# Patient Record
Sex: Female | Born: 1955 | Race: White | Hispanic: No | Marital: Single | Smoking: Never smoker
Health system: Southern US, Community
[De-identification: ages and names within clinical notes are randomized; demographics above are authoritative.]

## PROBLEM LIST (undated history)

## (undated) DIAGNOSIS — M810 Age-related osteoporosis without current pathological fracture: Secondary | ICD-10-CM

## (undated) DIAGNOSIS — I10 Essential (primary) hypertension: Secondary | ICD-10-CM

## (undated) HISTORY — PX: WISDOM TOOTH EXTRACTION: SHX21

## (undated) HISTORY — DX: Essential (primary) hypertension: I10

## (undated) HISTORY — PX: DILATION AND CURETTAGE OF UTERUS: SHX78

## (undated) HISTORY — PX: KNEE SURGERY: SHX244

## (undated) HISTORY — PX: INDUCED ABORTION: SHX677

## (undated) HISTORY — DX: Age-related osteoporosis without current pathological fracture: M81.0

---

## 2010-02-21 ENCOUNTER — Other Ambulatory Visit: Admission: RE | Admit: 2010-02-21 | Discharge: 2010-02-21 | Payer: Self-pay | Admitting: Family Medicine

## 2010-02-28 ENCOUNTER — Encounter: Admission: RE | Admit: 2010-02-28 | Discharge: 2010-02-28 | Payer: Self-pay | Admitting: Family Medicine

## 2010-03-02 ENCOUNTER — Emergency Department (HOSPITAL_COMMUNITY): Admission: EM | Admit: 2010-03-02 | Discharge: 2010-03-02 | Payer: Self-pay | Admitting: Emergency Medicine

## 2011-02-08 LAB — BASIC METABOLIC PANEL
BUN: 6 mg/dL (ref 6–23)
CO2: 26 mEq/L (ref 19–32)
Calcium: 8.9 mg/dL (ref 8.4–10.5)
Chloride: 96 mEq/L (ref 96–112)
Creatinine, Ser: 0.79 mg/dL (ref 0.4–1.2)
GFR calc Af Amer: >60 mL/min (ref >60)
Sodium: 131 mEq/L — ABNORMAL LOW (ref 135–145)

## 2011-02-08 LAB — DIFFERENTIAL
Basophils Relative: 0 % (ref 0–1)
Eosinophils Absolute: 0.1 10*3/uL (ref 0.0–0.7)
Eosinophils Relative: 1 % (ref 0–5)
Lymphs Abs: 2.6 10*3/uL (ref 0.7–4.0)
Monocytes Absolute: 0.7 10*3/uL (ref 0.1–1.0)
Neutro Abs: 4 10*3/uL (ref 1.7–7.7)
Neutrophils Relative %: 54 % (ref 43–77)

## 2011-02-08 LAB — URINALYSIS, ROUTINE W REFLEX MICROSCOPIC
Bilirubin Urine: NEGATIVE
Glucose, UA: NEGATIVE mg/dL
Hgb urine dipstick: NEGATIVE
Nitrite: NEGATIVE
Specific Gravity, Urine: 1.004 — ABNORMAL LOW (ref 1.005–1.030)
pH: 7 (ref 5.0–8.0)

## 2011-02-08 LAB — URINE MICROSCOPIC-ADD ON

## 2011-02-08 LAB — CBC
Hemoglobin: 15.1 g/dL — ABNORMAL HIGH (ref 12.0–15.0)
WBC: 7.5 10*3/uL (ref 4.0–10.5)

## 2011-10-23 IMAGING — CT CT HEAD W/O CM
1 of 2 series · 13 of 30 positions shown, 17 images · non-contrast
Comparison: None.

CLINICAL DATA: 53-year-old with hypertension.

CT HEAD WITHOUT CONTRAST
TECHNIQUE: Contiguous axial images were obtained from the base of
the skull through the vertex without contrast.

[Series 2: brain · axial · 0.49mm/px · z∈[+144,+281]mm · 13 of 32 slices shown, 17 images]
[im 3/32  brain]
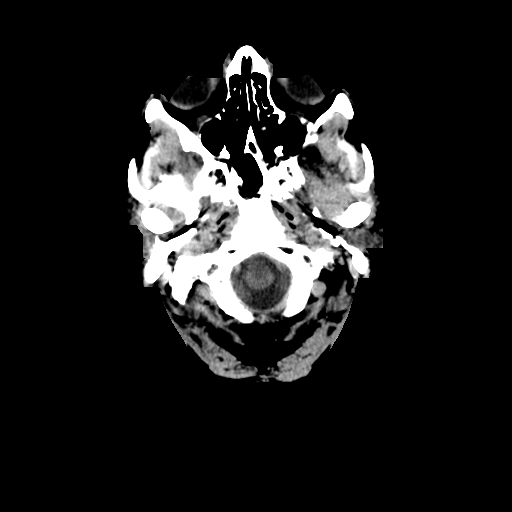
[im 3/32  bone]
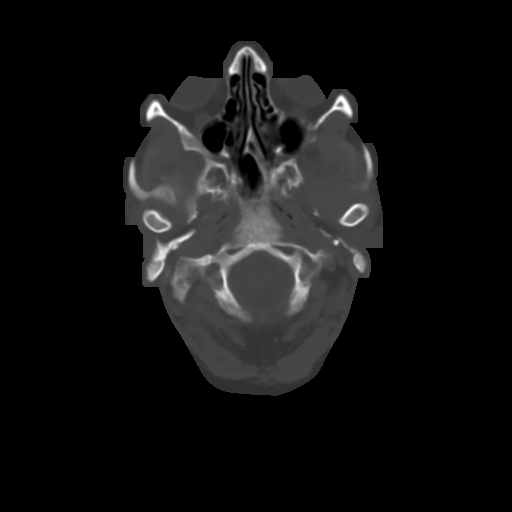
[im 5/32  brain]
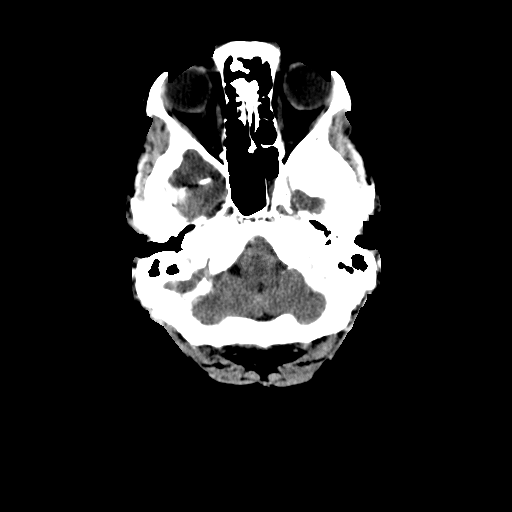
[im 7/32  brain]
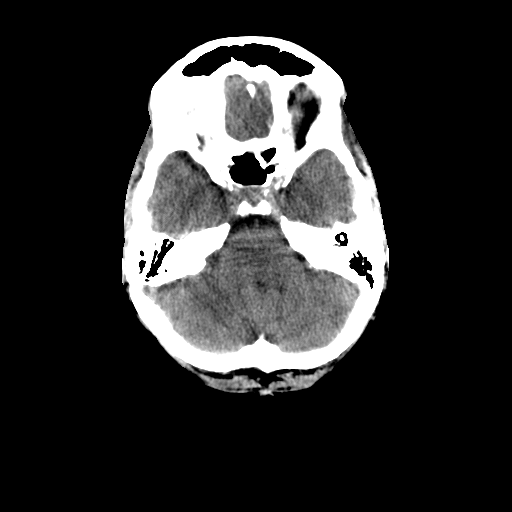
[im 9/32  brain]
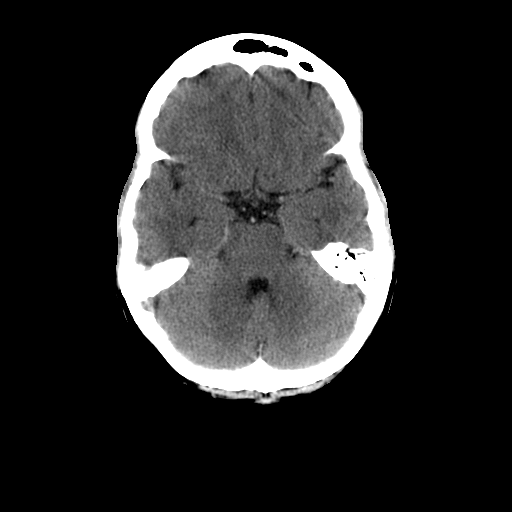
[im 12/32  brain]
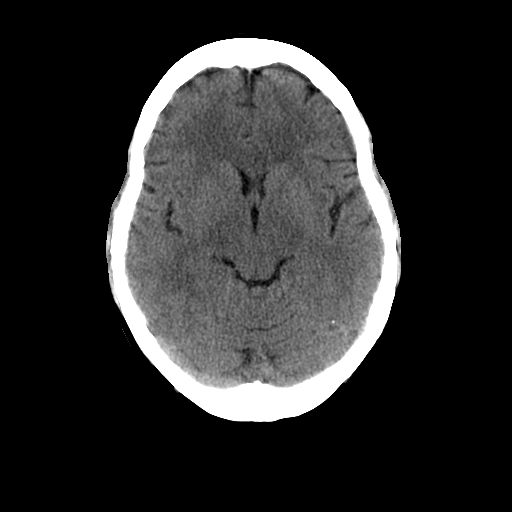
[im 12/32  bone]
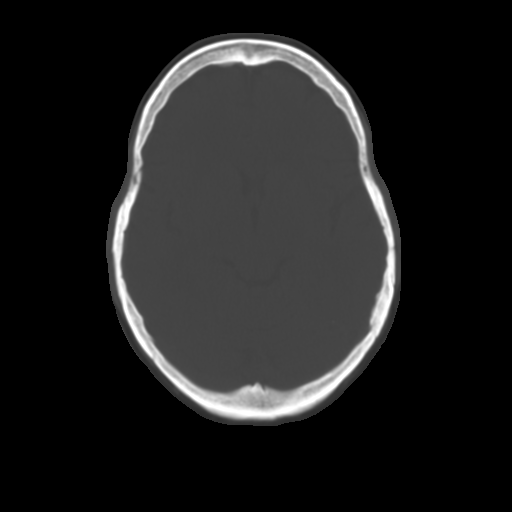
[im 14/32  brain]
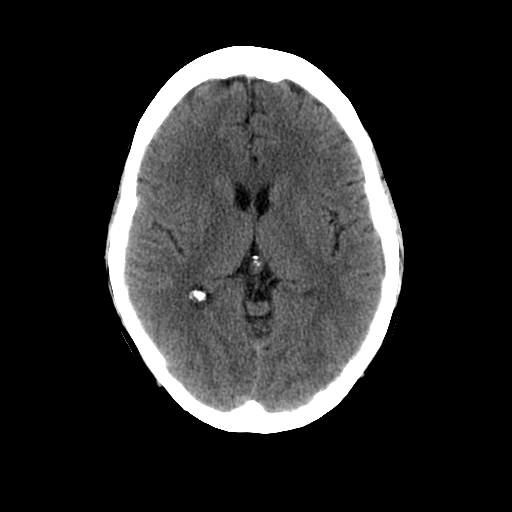
[im 16/32  brain]
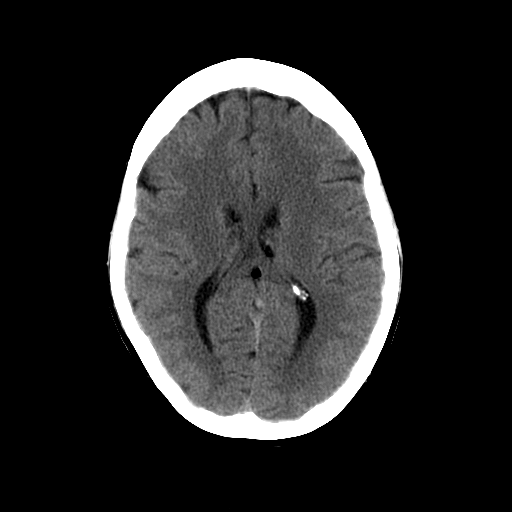
[im 18/32  brain]
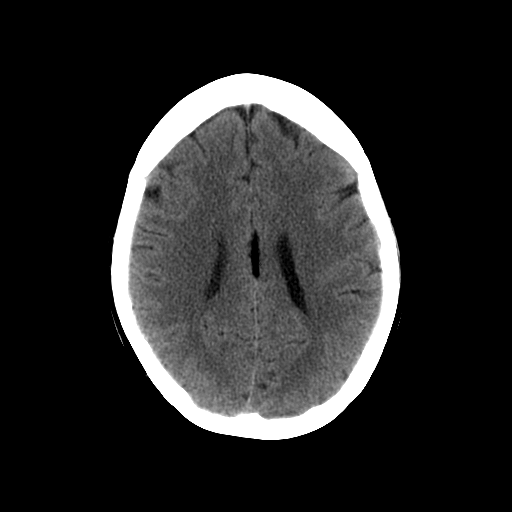
[im 20/32  brain]
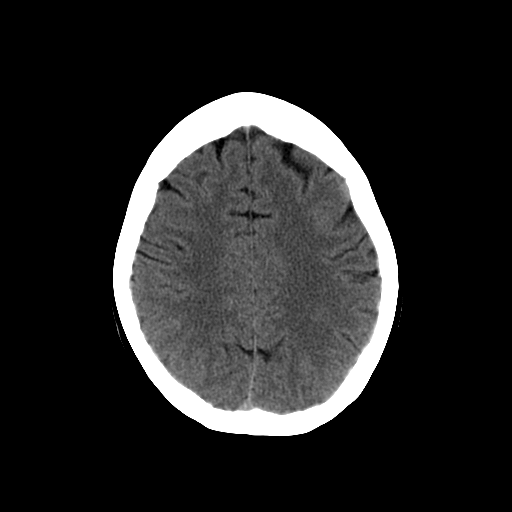
[im 20/32  bone]
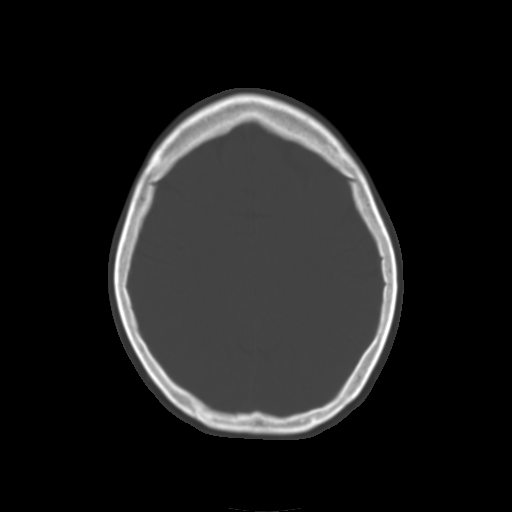
[im 23/32  brain]
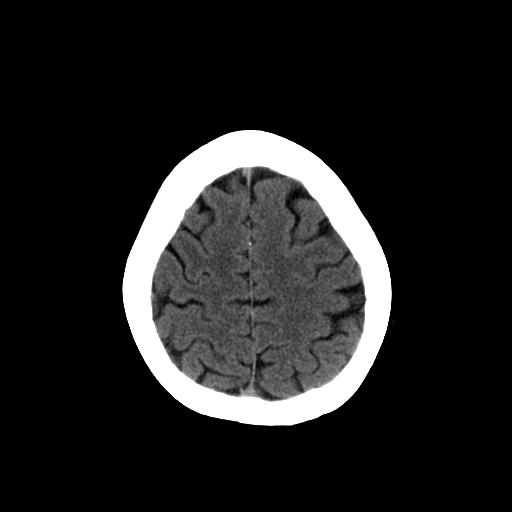
[im 25/32  brain]
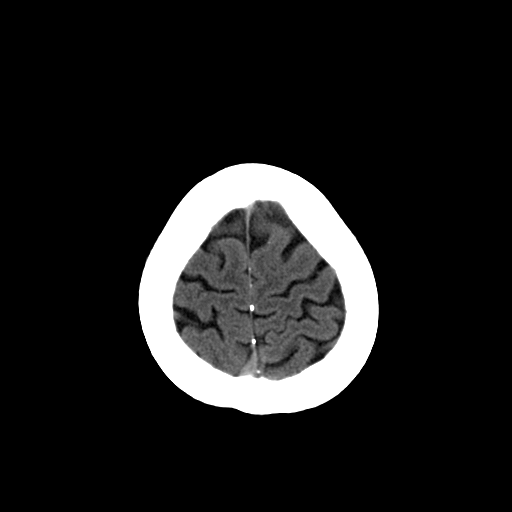
[im 27/32  brain]
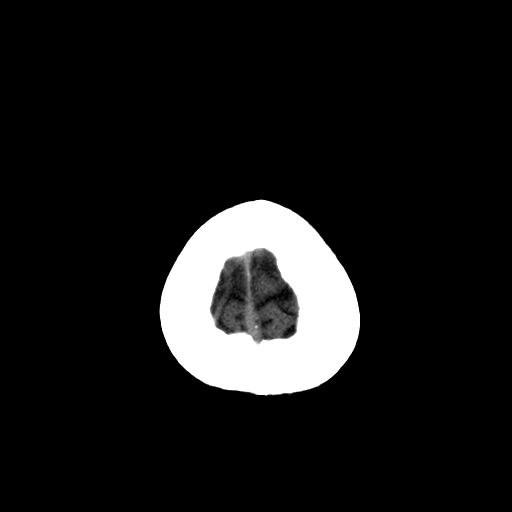
[im 29/32  brain]
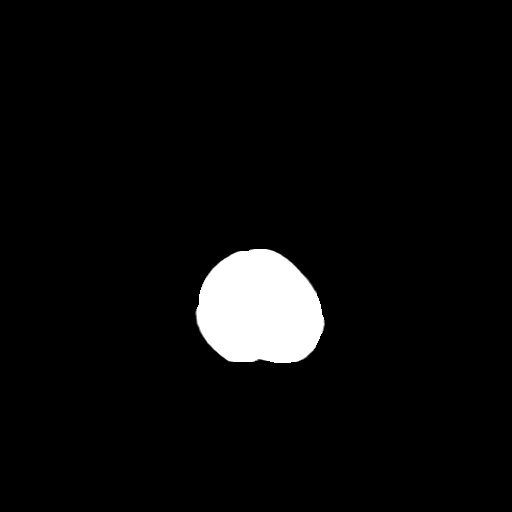
[im 29/32  bone]
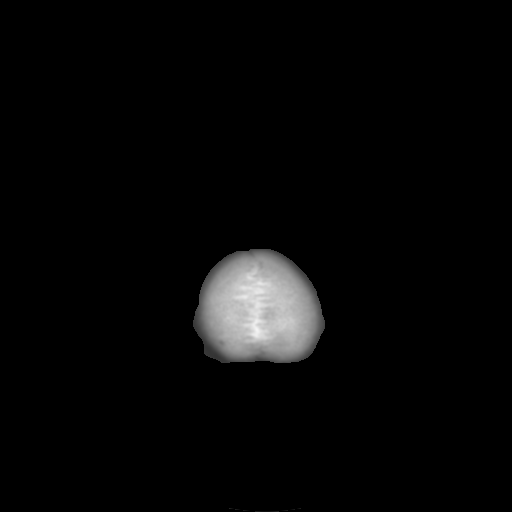

[13 of 30 positions shown; findings below may reference images not displayed]

FINDINGS: There is a fat density structure along the midline that
probably represents a lipoma of the corpus callosum.  Based on the
configuration of the lateral ventricles, there are may be partial
agenesis of the corpus callosum.  The is a low density along the
medial aspect the left lateral ventricle which is indeterminate.
No evidence for acute hemorrhage, midline shift, hydrocephalus or
large infarct. There is mucosal thickening or fluid in the left
sphenoid sinus.  No acute bony abnormality.
IMPRESSION: No acute intracranial abnormality.

Probable lipoma involving the corpus callosum with partially
agenesis of the corpus callosum.  These findings could be further
characterized with a non emergent MRI.

## 2013-08-06 ENCOUNTER — Other Ambulatory Visit: Payer: Self-pay | Admitting: Family Medicine

## 2013-08-06 ENCOUNTER — Other Ambulatory Visit (HOSPITAL_COMMUNITY)
Admission: RE | Admit: 2013-08-06 | Discharge: 2013-08-06 | Disposition: A | Discharge status: Home / Self Care | Payer: BC Managed Care – PPO | Source: Ambulatory Visit | Attending: Family Medicine | Admitting: Family Medicine

## 2013-08-06 DIAGNOSIS — Z124 Encounter for screening for malignant neoplasm of cervix: Secondary | ICD-10-CM | POA: Insufficient documentation

## 2013-08-06 DIAGNOSIS — R87810 Cervical high risk human papillomavirus (HPV) DNA test positive: Secondary | ICD-10-CM | POA: Insufficient documentation

## 2013-08-06 DIAGNOSIS — Z1151 Encounter for screening for human papillomavirus (HPV): Secondary | ICD-10-CM | POA: Insufficient documentation

## 2013-09-09 ENCOUNTER — Encounter: Payer: Self-pay | Admitting: Neurology

## 2013-09-10 ENCOUNTER — Encounter (INDEPENDENT_AMBULATORY_CARE_PROVIDER_SITE_OTHER): Payer: Self-pay

## 2013-09-10 ENCOUNTER — Encounter: Payer: Self-pay | Admitting: Neurology

## 2013-09-10 ENCOUNTER — Ambulatory Visit (INDEPENDENT_AMBULATORY_CARE_PROVIDER_SITE_OTHER): Payer: BC Managed Care – PPO | Admitting: Neurology

## 2013-09-10 VITALS — BP 139/94 | HR 70 | Ht 64.25 in | Wt 164.0 lb

## 2013-09-10 DIAGNOSIS — F329 Major depressive disorder, single episode, unspecified: Secondary | ICD-10-CM

## 2013-09-10 DIAGNOSIS — R4189 Other symptoms and signs involving cognitive functions and awareness: Secondary | ICD-10-CM

## 2013-09-10 DIAGNOSIS — F09 Unspecified mental disorder due to known physiological condition: Secondary | ICD-10-CM

## 2013-09-10 DIAGNOSIS — G4700 Insomnia, unspecified: Secondary | ICD-10-CM

## 2013-09-10 DIAGNOSIS — F3289 Other specified depressive episodes: Secondary | ICD-10-CM

## 2013-09-10 NOTE — Progress Notes (Signed)
[facility]    Provider:  Dr Hosie Poisson Referring Provider: Barbaraann Barthel Fanny Dance, MD Primary Care Physician:  Beverley Fiedler, MD  CC:  Cognitive decline  HPI:  Molly Vazquez is a 57 y.o. female here as a referral from Dr. Barbaraann Barthel for cognitive decline  Noticed progressive cognitive decline for the past several months. Having trouble, especially at work. 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 Forgets conversations she just had, forgets simple day to to things. Only involving short term memory. Has gotten lost a few times driving. Notes trouble with word finding. Daughter has noted trouble with short term memory. Personality has stayed the same. Feels remote memory is still good.   Patient has no history of strokes or TIAs. Great Aunt had Alzeimers, grandmother had some dementia but lived to 82. Has had 9 to mild to moderate concussions over her life time. Has some post-graduate studies. Has poor sleep schedule. Snored in the past but recently lost weight and does not think he snores anymore.Fatigued throughout the day. Has on going struggle with depression.    Prior records from PCP reviewed, were unremarkable. Head CT from 2011 reviewed showing possible lipoma.  Review of Systems: Out of a complete 14 system review, the patient complains of only the following symptoms, and all other reviewed systems are negative. Positive for fatigue shortness of breath feeling hot increased thirst flushing ringing in ears spinning sensation memory loss headache dizziness insomnia not enough sleep  History   Social History  . Marital Status: Single    Spouse Name: N/A    Number of Children: 1  . Years of Education: college   Occupational History  . Not on file.   Social History Main Topics  . Smoking status: Never Smoker   . Smokeless tobacco: Never  Used  . Alcohol Use: Yes  . Drug Use: No  . Sexual Activity: No   Other Topics Concern  . Not on file   Social History Narrative  . No narrative on file    Family History  Problem Relation Age of Onset  . Congestive Heart Failure Father   . Heart disease Father   . Diabetes Father   . Colon cancer Father   . Osteoporosis Mother     Past Medical History  Diagnosis Date  . Osteoporosis   . HTN (hypertension)     Past Surgical History  Procedure Laterality Date  . Knee surgery Right   . Cesarean section    . Dilation and curettage of uterus    . Induced abortion    . Wisdom tooth extraction      Current Outpatient Prescriptions  Medication Sig Dispense Refill  . hydrochlorothiazide (MICROZIDE) 12.5 MG capsule Take 12.5 mg by mouth daily.       No current facility-administered medications for this visit.    Allergies as of 09/10/2013  . (No Known Allergies)    Vitals: BP 139/94  Pulse 70  Ht 5' 4.25" (1.632 m)  Wt 164 lb (74.39 kg)  BMI 27.93 kg/m2 Last Weight:  Wt Readings from Last 1 Encounters:  09/10/13 164 lb (74.39 kg)   Last Height:   Ht Readings from Last 1 Encounters:  09/10/13 5' 4.25" (1.632 m)     Physical exam: Exam: Gen: NAD, conversant Eyes: anicteric sclerae, moist conjunctivae HENT: Atraumatic, oropharynx clear Neck: Trachea midline; supple,  Lungs: CTA, no  wheezing, rales, rhonic                          CV: RRR, no MRG Abdomen: Soft, non-tender;  Extremities: No peripheral edema  Skin: Normal temperature, no rash,  Psych: Appropriate affect, pleasant  Neuro: MS: AA&Ox3, appropriately interactive, normal affect   MOCA 27/30  CN: PERRL, EOMI no nystagmus, no ptosis, decrease to light touch on V3 on the right, face symmetric, no weakness, hearing grossly intact, palate elevates symmetrically, shoulder shrug 5/5 bilat,  tongue protrudes midline, no fasiculations noted.  Motor: normal bulk and tone Strength: 5/5  In all  extremities  Coord: rapid alternating and point-to-point (FNF, HTS) movements intact.  Reflexes: symmetrical, bilat downgoing toes  Sens: LT intact in all extremities  Gait: posture, stance, stride and arm-swing normal. Tandem gait intact. Able to walk on heels and toes. Romberg absent.   Assessment:  After physical and neurologic examination, review of laboratory studies, imaging, neurophysiology testing and pre-existing records, assessment will be reviewed on the problem list.  Plan:  Treatment plan and additional workup will be reviewed under Problem List.  1)Cognitive decline 2)Insomnia 3)Depression  57 year old woman presenting for initial evaluation of cognitive decline. Notes this is most notable with short term memory. Physical exam unremarkable,MOCA score of 27/30. Unclear etiology of symptoms. Differential includes a neurodegenerative process, a metabolic deficiency, possible sleep apnea and or depression anxiety. Will check lab work and MRI of the brain at this time. Pending this workup can consider EEG and or sleep study in the future. Will hold off on medications at this time pending workup. Patient counseled to work on improving her sleep hygiene. Counseled her that trying melatonin 5 mg nightly may help improve her sleep.

## 2013-09-10 NOTE — Patient Instructions (Signed)
Overall you are doing fairly well but I do want to suggest a few things today:   Remember to drink plenty of fluid, eat healthy meals and do not skip any meals. Try to eat protein with a every meal and eat a healthy snack such as fruit or nuts in between meals. Try to keep a regular sleep-wake schedule and try to exercise daily, particularly in the form of walking, 20-30 minutes a day, if you can.   As far as diagnostic testing:  1)MRI of the brain 2)Blood work to check for some vitamin deficiencies  I would like to see you back in 6 months, sooner if we need to. Please call us with any interim questions, concerns, problems, updates or refill requests.   Please also call us for any test results so we can go over those with you on the phone.  My clinical assistant and will answer any of your questions and relay your messages to me and also relay most of my messages to you.   Our phone number is [phone]. We also have an after hours call service for urgent matters and there is a physician on-call for urgent questions. For any emergencies you know to call 911 or go to the nearest emergency room

## 2013-09-16 LAB — METHYLMALONIC ACID, SERUM: Methylmalonic Acid: 246 nmol/L (ref 0–378)

## 2013-09-16 LAB — VITAMIN B1, WHOLE BLOOD: Thiamine: 95.2 nmol/L (ref 66.5–200.0)

## 2013-09-16 LAB — HOMOCYSTEINE: Homocysteine: 16.8 umol/L — ABNORMAL HIGH (ref 0.0–15.0)

## 2013-09-16 LAB — TSH: TSH: 1.89 u[IU]/mL (ref 0.450–4.500)

## 2013-09-17 NOTE — Progress Notes (Signed)
Quick Note:  I called and LMVM for Molly Vazquez, daughter that lab work was normal, to let her mother know. She is to call back if questions. ______

## 2013-09-24 ENCOUNTER — Ambulatory Visit (INDEPENDENT_AMBULATORY_CARE_PROVIDER_SITE_OTHER): Payer: BC Managed Care – PPO

## 2013-09-24 DIAGNOSIS — R4189 Other symptoms and signs involving cognitive functions and awareness: Secondary | ICD-10-CM

## 2013-09-24 DIAGNOSIS — F09 Unspecified mental disorder due to known physiological condition: Secondary | ICD-10-CM

## 2013-09-26 ENCOUNTER — Telehealth: Payer: Self-pay | Admitting: Neurology

## 2013-09-26 NOTE — Telephone Encounter (Signed)
Called patient to discuss MRI findings. She expressed understanding. She continues to have difficulty with sleep. She will try melatonin 5mg  nightly. Scheduled to follow up in April 2015.

## 2013-10-30 ENCOUNTER — Telehealth: Payer: Self-pay | Admitting: Neurology

## 2013-10-30 NOTE — Telephone Encounter (Signed)
Left message for patient to call and reschedule 03/11/14 appointment per Dr Minus Breeding schedule.

## 2013-12-05 ENCOUNTER — Encounter: Payer: Self-pay | Admitting: Neurology

## 2014-03-11 ENCOUNTER — Ambulatory Visit: Payer: BC Managed Care – PPO | Admitting: Neurology

## 2014-08-12 ENCOUNTER — Other Ambulatory Visit (HOSPITAL_COMMUNITY)
Admission: RE | Admit: 2014-08-12 | Discharge: 2014-08-12 | Disposition: A | Discharge status: Home / Self Care | Payer: Managed Care, Other (non HMO) | Source: Ambulatory Visit | Attending: Family Medicine | Admitting: Family Medicine

## 2014-08-12 ENCOUNTER — Other Ambulatory Visit: Payer: Self-pay | Admitting: Family Medicine

## 2014-08-12 DIAGNOSIS — Z124 Encounter for screening for malignant neoplasm of cervix: Secondary | ICD-10-CM | POA: Insufficient documentation

## 2014-08-12 DIAGNOSIS — Z1151 Encounter for screening for human papillomavirus (HPV): Secondary | ICD-10-CM | POA: Diagnosis present

## 2014-08-17 LAB — CYTOLOGY - PAP

## 2014-09-11 ENCOUNTER — Telehealth: Payer: Self-pay | Admitting: Neurology

## 2014-09-11 NOTE — Telephone Encounter (Signed)
Called pt to rescheduled 03/11/14 appointment (former Dr. Hosie PoissonSumner patient), left message on vociemail requesting to return call to reschedule with another Neurologist.

## 2016-07-15 ENCOUNTER — Emergency Department (HOSPITAL_COMMUNITY): Payer: Managed Care, Other (non HMO)

## 2016-07-15 ENCOUNTER — Encounter (HOSPITAL_COMMUNITY): Payer: Self-pay | Admitting: Emergency Medicine

## 2016-07-15 ENCOUNTER — Emergency Department (HOSPITAL_COMMUNITY)
Admission: EM | Admit: 2016-07-15 | Discharge: 2016-07-16 | Disposition: A | Discharge status: Home / Self Care | Payer: Managed Care, Other (non HMO) | Attending: Emergency Medicine | Admitting: Emergency Medicine

## 2016-07-15 DIAGNOSIS — Z5181 Encounter for therapeutic drug level monitoring: Secondary | ICD-10-CM | POA: Insufficient documentation

## 2016-07-15 DIAGNOSIS — I10 Essential (primary) hypertension: Secondary | ICD-10-CM | POA: Insufficient documentation

## 2016-07-15 DIAGNOSIS — R0602 Shortness of breath: Secondary | ICD-10-CM | POA: Insufficient documentation

## 2016-07-15 DIAGNOSIS — R4182 Altered mental status, unspecified: Secondary | ICD-10-CM | POA: Insufficient documentation

## 2016-07-15 LAB — COMPREHENSIVE METABOLIC PANEL
ALBUMIN: 4.2 g/dL (ref 3.5–5.0)
ALT: 19 U/L (ref 14–54)
AST: 22 U/L (ref 15–41)
Alkaline Phosphatase: 57 U/L (ref 38–126)
Anion gap: 8 (ref 5–15)
BUN: 11 mg/dL (ref 6–20)
CALCIUM: 9 mg/dL (ref 8.9–10.3)
CHLORIDE: 109 mmol/L (ref 101–111)
CO2: 23 mmol/L (ref 22–32)
CREATININE: 0.87 mg/dL (ref 0.44–1.00)
GFR calc Af Amer: >60 mL/min (ref >60)
Glucose, Bld: 112 mg/dL — ABNORMAL HIGH (ref 65–99)
POTASSIUM: 3.5 mmol/L (ref 3.5–5.1)
SODIUM: 140 mmol/L (ref 135–145)
Total Bilirubin: 1 mg/dL (ref 0.3–1.2)
Total Protein: 7.5 g/dL (ref 6.5–8.1)

## 2016-07-15 LAB — CBC WITH DIFFERENTIAL/PLATELET
BASOS ABS: 0 10*3/uL (ref 0.0–0.1)
BASOS PCT: 0 %
EOS ABS: 0.2 10*3/uL (ref 0.0–0.7)
EOS PCT: 4 %
HCT: 41.7 % (ref 36.0–46.0)
Hemoglobin: 14.3 g/dL (ref 12.0–15.0)
Lymphocytes Relative: 51 %
Lymphs Abs: 3.5 10*3/uL (ref 0.7–4.0)
MCH: 31.2 pg (ref 26.0–34.0)
MCHC: 34.3 g/dL (ref 30.0–36.0)
MCV: 91 fL (ref 78.0–100.0)
MONO ABS: 0.7 10*3/uL (ref 0.1–1.0)
Monocytes Relative: 10 %
Neutro Abs: 2.4 10*3/uL (ref 1.7–7.7)
Neutrophils Relative %: 35 %
PLATELETS: 147 10*3/uL — AB (ref 150–400)
RBC: 4.58 MIL/uL (ref 3.87–5.11)
RDW: 13.3 % (ref 11.5–15.5)
WBC: 6.8 10*3/uL (ref 4.0–10.5)

## 2016-07-15 LAB — ETHANOL: Alcohol, Ethyl (B): 43 mg/dL — ABNORMAL HIGH (ref <5)

## 2016-07-15 LAB — TROPONIN I: Troponin I: <0.03 ng/mL (ref <0.03)

## 2016-07-15 LAB — RAPID URINE DRUG SCREEN, HOSP PERFORMED
Amphetamines: NOT DETECTED
Barbiturates: NOT DETECTED
Benzodiazepines: NOT DETECTED
COCAINE: NOT DETECTED
OPIATES: NOT DETECTED
Tetrahydrocannabinol: POSITIVE — AB

## 2016-07-15 LAB — CBG MONITORING, ED: GLUCOSE-CAPILLARY: 98 mg/dL (ref 65–99)

## 2016-07-15 MED ORDER — SODIUM CHLORIDE 0.9 % IV BOLUS (SEPSIS)
2000.0000 mL | Freq: Once | INTRAVENOUS | Status: AC
  Administered 2016-07-15: 2000 mL via INTRAVENOUS

## 2016-07-15 MED ORDER — SODIUM CHLORIDE 0.9 % IV SOLN
INTRAVENOUS | Status: DC
  Administered 2016-07-15: 23:00:00 via INTRAVENOUS

## 2016-07-15 MED ORDER — LORAZEPAM 2 MG/ML IJ SOLN
1.0000 mg | Freq: Once | INTRAMUSCULAR | Status: DC
  Filled 2016-07-15: qty 1

## 2016-07-15 NOTE — ED Triage Notes (Signed)
Pt initially told registration that she was here for tachycardia. When staff got her into a triage room pt stated that she was unsure why she was here, unsure how she got here. Pt kept repeating that she "is not a bad person" "she is not crazy" and "she doesn't normally do this type of thing". Pt is very anxious at time of assessment

## 2016-07-15 NOTE — ED Provider Notes (Addendum)
[facility]-EMERGENCY DEPT Provider Note   CSN: [id number] Arrival date & time: 07/15/16  2152     History   Chief Complaint Chief Complaint  Patient presents with  . Altered Mental Status    HPI Molly Vazquez is a 60 y.o. female.  60 year old female presents complaining of increased heart rate as well as anxiety. Patient notes that she did drink 1 glass of alcohol tonight and states that she drinks daily. Denies any chest pain or chest pressure. No shortness of breath. Denies any focal weakness. No illicit drug use. No prior history of same. Is here visiting from [place]. No recent history of blood loss. Denies any suicidal or homicidal ideations. No treatment used prior to arrival.      Past Medical History:  Diagnosis Date  . HTN (hypertension)   . Osteoporosis     Patient Active Problem List   Diagnosis Date Noted  . Cognitive decline 09/10/2013  . Insomnia 09/10/2013    Past Surgical History:  Procedure Laterality Date  . CESAREAN SECTION    . DILATION AND CURETTAGE OF UTERUS    . INDUCED ABORTION    . KNEE SURGERY Right   . WISDOM TOOTH EXTRACTION      OB History    No data available       Home Medications    Prior to Admission medications   Medication Sig Start Date End Date Taking? Authorizing Provider  hydrochlorothiazide (MICROZIDE) 12.5 MG capsule Take 12.5 mg by mouth daily.    Historical Provider, MD    Family History Family History  Problem Relation Age of Onset  . Congestive Heart Failure Father   . Heart disease Father   . Diabetes Father   . Colon cancer Father   . Osteoporosis Mother     Social History Social History  Substance Use Topics  . Smoking status: Never Smoker  . Smokeless tobacco: Never Used  . Alcohol use Yes     Allergies   Review of patient's allergies indicates no known allergies.   Review of Systems Review of Systems  All other systems reviewed and are negative.    Physical Exam Updated Vital  Signs BP (!) 198/110 (BP Location: Left Arm)   Pulse 115   Resp 14   SpO2 99%   Physical Exam  Constitutional: She is oriented to person, place, and time. She appears well-developed and well-nourished.  Non-toxic appearance. No distress.  HENT:  Head: Normocephalic and atraumatic.  Eyes: Conjunctivae, EOM and lids are normal. Pupils are equal, round, and reactive to light.  Neck: Normal range of motion. Neck supple. No tracheal deviation present. No thyroid mass present.  Cardiovascular: Regular rhythm and normal heart sounds.  Tachycardia present.  Exam reveals no gallop.   No murmur heard. Pulmonary/Chest: Effort normal and breath sounds normal. No stridor. No respiratory distress. She has no decreased breath sounds. She has no wheezes. She has no rhonchi. She has no rales.  Abdominal: Soft. Normal appearance and bowel sounds are normal. She exhibits no distension. There is no tenderness. There is no rebound and no CVA tenderness.  Musculoskeletal: Normal range of motion. She exhibits no edema or tenderness.  Neurological: She is alert and oriented to person, place, and time. No cranial nerve deficit or sensory deficit. She displays a negative Romberg sign. GCS eye subscore is 4. GCS verbal subscore is 5. GCS motor subscore is 6.  Skin: Skin is warm and dry. No abrasion and no rash noted.  Psychiatric: Her  mood appears anxious. Her speech is rapid and/or pressured.  Nursing note and vitals reviewed.    ED Treatments / Results  Labs (all labs ordered are listed, but only abnormal results are displayed) Labs Reviewed  CBC WITH DIFFERENTIAL/PLATELET  COMPREHENSIVE METABOLIC PANEL  URINE RAPID DRUG SCREEN, HOSP PERFORMED  ETHANOL  CBG MONITORING, ED    EKG  EKG Interpretation  Date/Time:  Saturday July 15 2016 22:07:48 EDT Ventricular Rate:  117 PR Interval:    QRS Duration: 87 QT Interval:  317 QTC Calculation: 443 R Axis:   59 Text Interpretation:  Sinus tachycardia  Probable left atrial enlargement Low voltage, precordial leads Confirmed by Aaronjames Kelsay  MD, Hazley Dezeeuw ([id number]) on 07/15/2016 11:22:19 PM       Radiology No results found.  Procedures Procedures (including critical care time)  Medications Ordered in ED Medications  0.9 %  sodium chloride infusion (not administered)  sodium chloride 0.9 % bolus 2,000 mL (not administered)  LORazepam (ATIVAN) injection 1 mg (not administered)     Initial Impression / Assessment and Plan / ED Course  I have reviewed the triage vital signs and the nursing notes.  Pertinent labs & imaging results that were available during my care of the patient were reviewed by me and considered in my medical decision making (see chart for details).  Clinical Course   Patient's old records reviewed and patient seen 2 years ago and diagnosed with cognitive decline. Suspect that her symptoms are related currently to anxiety from that. She had no focal neurological deficits. Suspect that her symptoms are from anxiety. She has no focal deficits. She is speaking clearly. Her EtOH level was 48. This could contribute to this.. Was instructed to follow-up with her doctor.  Final Clinical Impressions(s) / ED Diagnoses   Final diagnoses:  None    New Prescriptions New Prescriptions   No medications on file     Lorre Nick, MD 07/15/16 2324    Lorre Nick, MD 07/15/16 2332

## 2016-07-15 NOTE — ED Notes (Signed)
Pt reports increasing anxiety tonight and reports drinking 1 glass of wine tonight. Pressured speech.

## 2018-03-07 IMAGING — CR DG CHEST 2V
2 series · 2 of 2 positions shown · non-contrast
Comparison: None.

CLINICAL DATA: Tachycardia, jaw pain and shortness of breath.
History of hypertension and anxiety.

EXAM:
CHEST  2 VIEW

[w chest lat]
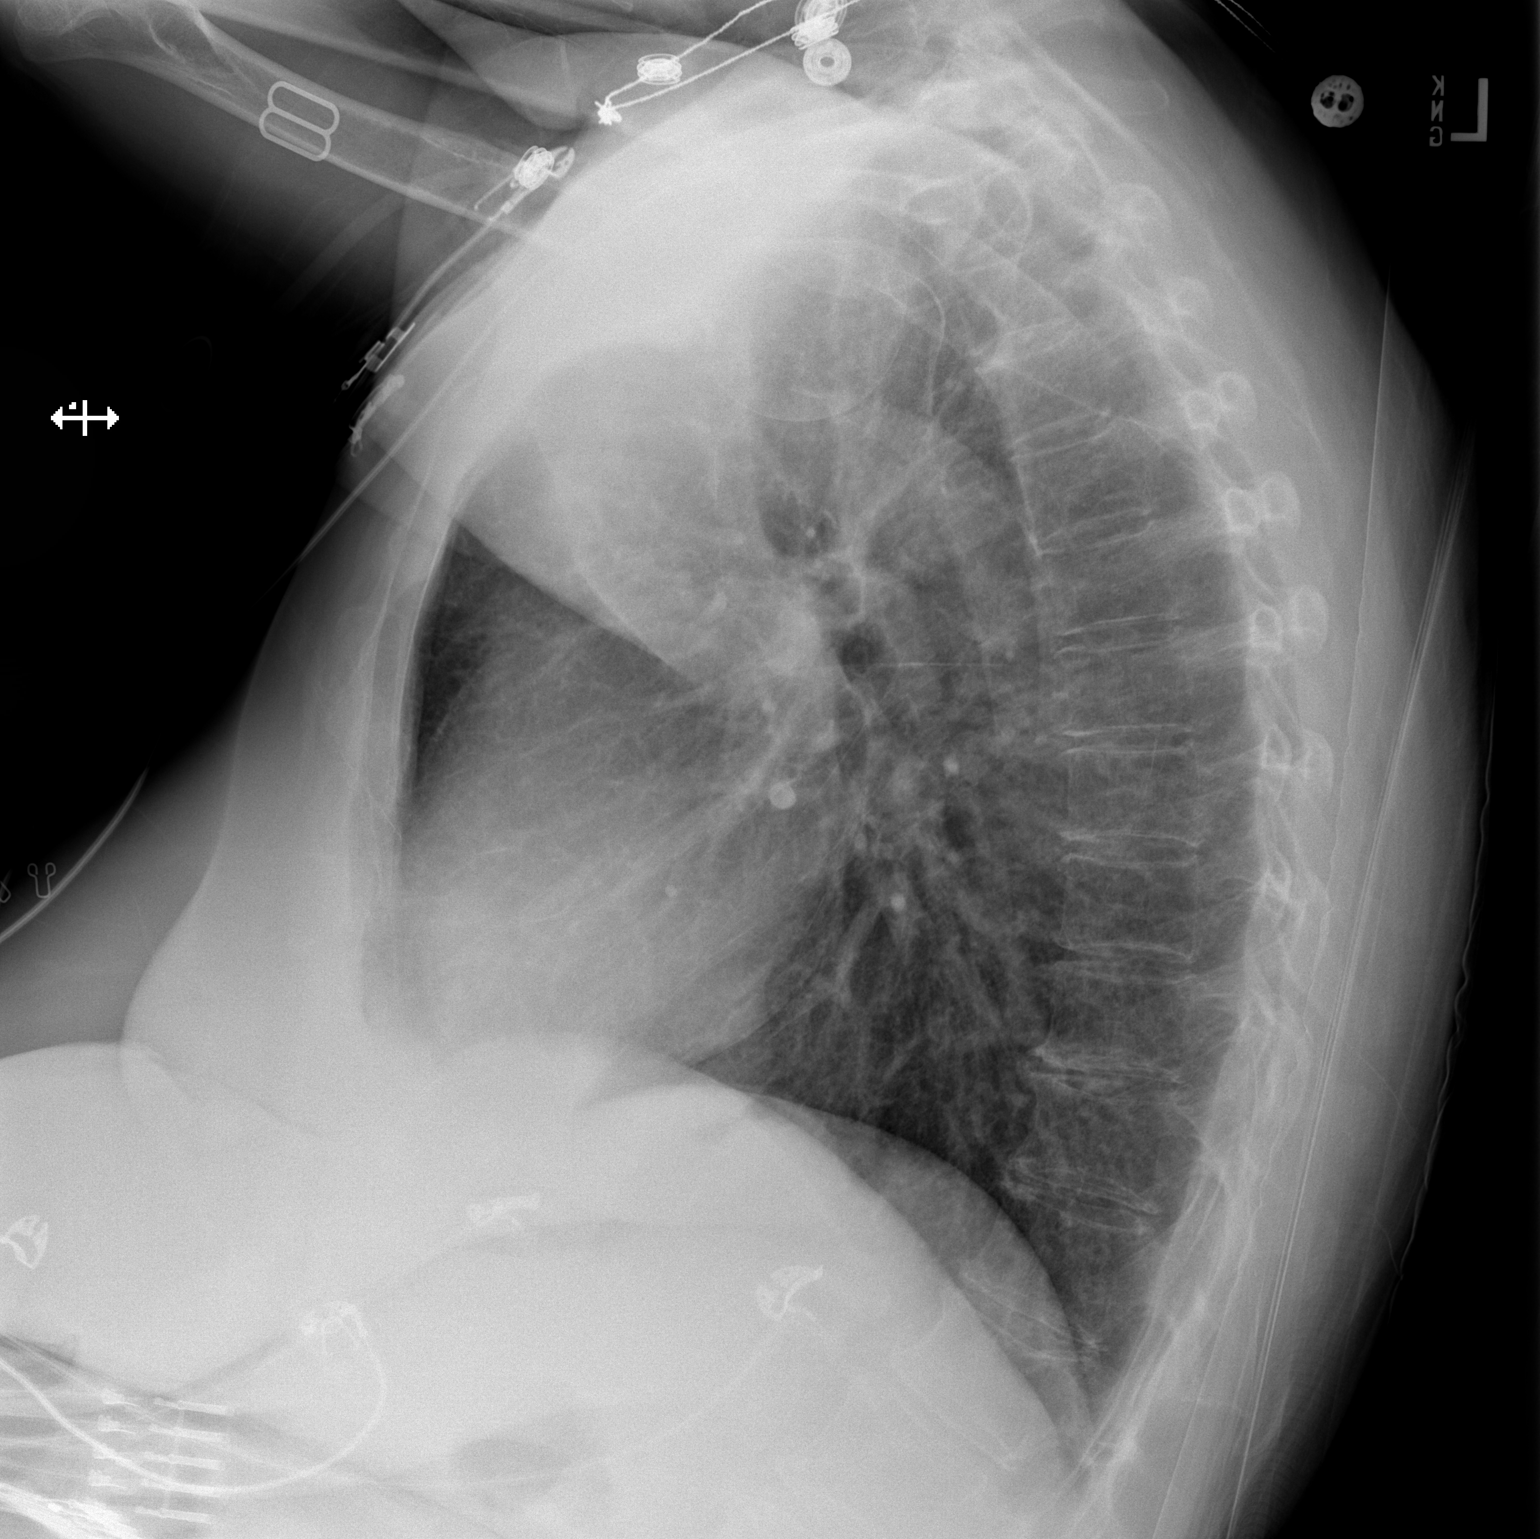

[x chest ap]
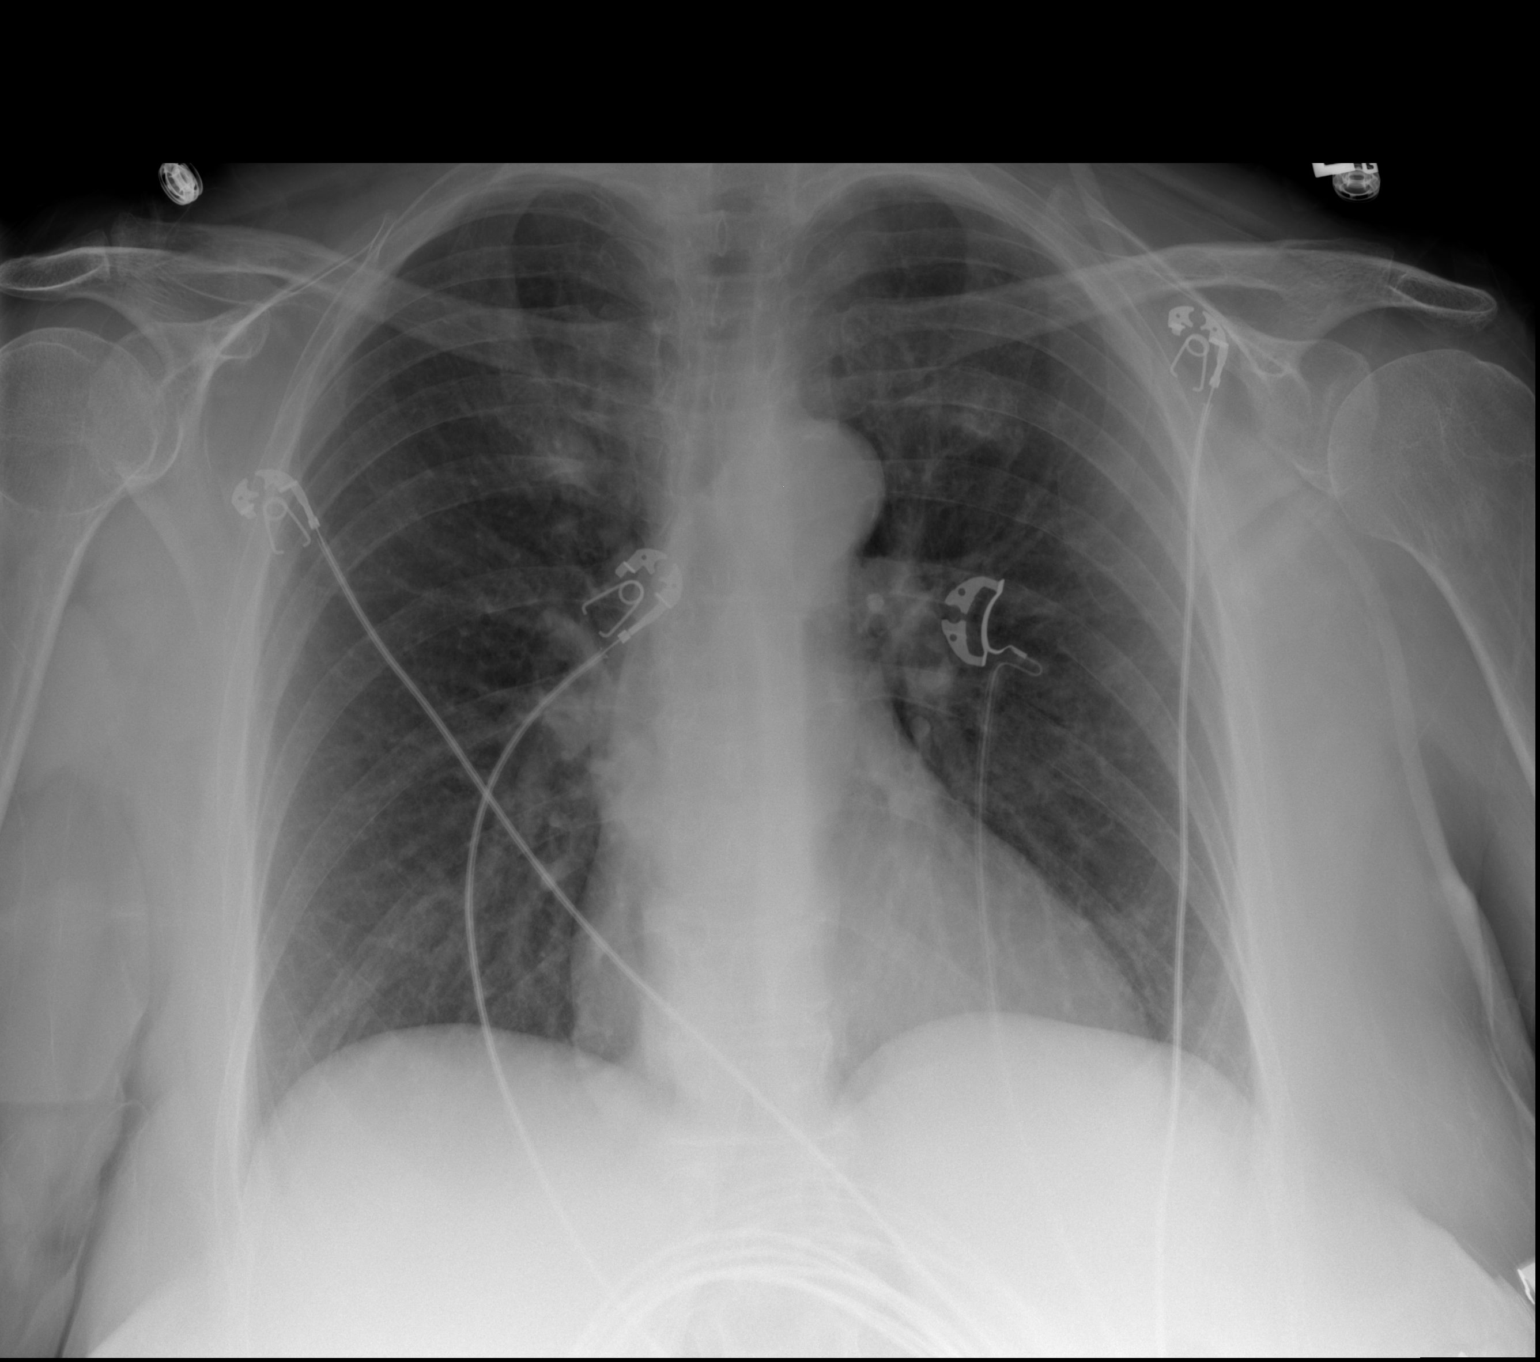

[2 of 2 positions shown; findings below may reference images not displayed]

FINDINGS: Cardiomediastinal silhouette is normal. Minimal calcific
atherosclerosis of the aortic knob. No pleural effusions or focal
consolidations. Trachea projects midline and there is no
pneumothorax. Soft tissue planes and included osseous structures are
non-suspicious. Mild chronic appearing T12 compression fracture.
IMPRESSION: No acute cardiopulmonary process.
